# Patient Record
Sex: Female | Born: 1960 | Race: White | Hispanic: No | Marital: Married | Smoking: Never smoker
Health system: Southern US, Community
[De-identification: ages and names within clinical notes are randomized; demographics above are authoritative.]

## PROBLEM LIST (undated history)

## (undated) DIAGNOSIS — IMO0002 Reserved for concepts with insufficient information to code with codable children: Secondary | ICD-10-CM

## (undated) DIAGNOSIS — Z87828 Personal history of other (healed) physical injury and trauma: Secondary | ICD-10-CM

## (undated) HISTORY — PX: DILATION AND CURETTAGE OF UTERUS: SHX78

## (undated) HISTORY — DX: Reserved for concepts with insufficient information to code with codable children: IMO0002

## (undated) HISTORY — PX: BRAIN SURGERY: SHX531

## (undated) HISTORY — PX: OTHER SURGICAL HISTORY: SHX169

---

## 1991-05-03 DIAGNOSIS — IMO0002 Reserved for concepts with insufficient information to code with codable children: Secondary | ICD-10-CM

## 1991-05-03 HISTORY — DX: Reserved for concepts with insufficient information to code with codable children: IMO0002

## 1999-03-16 ENCOUNTER — Other Ambulatory Visit: Admission: RE | Admit: 1999-03-16 | Discharge: 1999-03-16 | Payer: Self-pay | Admitting: Obstetrics and Gynecology

## 2000-02-25 ENCOUNTER — Ambulatory Visit (HOSPITAL_COMMUNITY): Admission: RE | Admit: 2000-02-25 | Discharge: 2000-02-25 | Payer: Self-pay | Admitting: Obstetrics and Gynecology

## 2000-02-25 ENCOUNTER — Encounter: Payer: Self-pay | Admitting: Obstetrics and Gynecology

## 2000-03-16 ENCOUNTER — Other Ambulatory Visit: Admission: RE | Admit: 2000-03-16 | Discharge: 2000-03-16 | Payer: Self-pay | Admitting: Obstetrics and Gynecology

## 2001-03-22 ENCOUNTER — Other Ambulatory Visit: Admission: RE | Admit: 2001-03-22 | Discharge: 2001-03-22 | Payer: Self-pay | Admitting: Obstetrics and Gynecology

## 2001-08-25 ENCOUNTER — Encounter: Payer: Self-pay | Admitting: Emergency Medicine

## 2001-08-25 ENCOUNTER — Inpatient Hospital Stay (HOSPITAL_COMMUNITY): Admission: AC | Admit: 2001-08-25 | Discharge: 2001-08-29 | Payer: Self-pay

## 2001-08-26 ENCOUNTER — Encounter: Payer: Self-pay | Admitting: General Surgery

## 2001-08-27 ENCOUNTER — Encounter: Payer: Self-pay | Admitting: General Surgery

## 2003-01-21 ENCOUNTER — Other Ambulatory Visit: Admission: RE | Admit: 2003-01-21 | Discharge: 2003-01-21 | Payer: Self-pay | Admitting: Obstetrics and Gynecology

## 2003-02-07 ENCOUNTER — Encounter: Payer: Self-pay | Admitting: Obstetrics and Gynecology

## 2003-02-07 ENCOUNTER — Ambulatory Visit (HOSPITAL_COMMUNITY): Admission: RE | Admit: 2003-02-07 | Discharge: 2003-02-07 | Payer: Self-pay | Admitting: Obstetrics and Gynecology

## 2004-03-08 ENCOUNTER — Ambulatory Visit (HOSPITAL_COMMUNITY): Admission: RE | Admit: 2004-03-08 | Discharge: 2004-03-08 | Payer: Self-pay | Admitting: Obstetrics and Gynecology

## 2005-01-13 ENCOUNTER — Other Ambulatory Visit: Admission: RE | Admit: 2005-01-13 | Discharge: 2005-01-13 | Payer: Self-pay | Admitting: Obstetrics and Gynecology

## 2005-03-09 ENCOUNTER — Ambulatory Visit (HOSPITAL_COMMUNITY): Admission: RE | Admit: 2005-03-09 | Discharge: 2005-03-09 | Payer: Self-pay | Admitting: Obstetrics and Gynecology

## 2005-03-15 ENCOUNTER — Encounter: Admission: RE | Admit: 2005-03-15 | Discharge: 2005-03-15 | Payer: Self-pay

## 2005-08-14 ENCOUNTER — Emergency Department (HOSPITAL_COMMUNITY): Admission: EM | Admit: 2005-08-14 | Discharge: 2005-08-15 | Payer: Self-pay | Admitting: Emergency Medicine

## 2006-03-21 ENCOUNTER — Other Ambulatory Visit: Admission: RE | Admit: 2006-03-21 | Discharge: 2006-03-21 | Payer: Self-pay | Admitting: Obstetrics and Gynecology

## 2006-03-29 ENCOUNTER — Ambulatory Visit (HOSPITAL_COMMUNITY): Admission: RE | Admit: 2006-03-29 | Discharge: 2006-03-29 | Payer: Self-pay | Admitting: Obstetrics and Gynecology

## 2008-01-09 ENCOUNTER — Encounter: Admission: RE | Admit: 2008-01-09 | Discharge: 2008-01-09 | Payer: Self-pay | Admitting: Obstetrics and Gynecology

## 2010-05-22 ENCOUNTER — Encounter: Payer: Self-pay | Admitting: Obstetrics and Gynecology

## 2010-05-24 ENCOUNTER — Encounter: Payer: Self-pay | Admitting: Obstetrics and Gynecology

## 2010-09-17 NOTE — Discharge Summary (Signed)
[place]. [facility]  Patient:    Mackenzie Torres, Mackenzie Torres Visit Number: [id number] MRN: [id number]          Service Type: TRA Location: 5700 [id number] [id number] Attending Physician:  Trauma, Md Dictated by:   Eugenia Pancoast, P.A. Admit Date:  08/25/2001 Discharge Date: 08/29/2001   CC:         Barbara Cower Mohorn, D.D.S.   Discharge Summary  ADMISSION PHYSICIAN:  Lorne Skeens. Hoxworth, M.D.  FINAL DIAGNOSES: 1. Blunt head trauma. 2. Large scalp laceration. 3. Left shoulder contusion.  HISTORY OF PRESENT ILLNESS:  The patient is a 50 year old female who was working in her barn.  They were installing horse stalls in the barn with 600 pound panels they were putting up. One panel fell and caught her pushing her back and then hitting her in the back of her head which caused a severe flap laceration to the posterior scalp.  She was pinned down to the ground and her left shoulder was injured.  HOSPITAL COURSE:  The patient was brought to the [facility] emergency department and was seen there by Dr. Johna Sheriff.  The severity of the laceration required the patient to be taken to the operating room for cleaning of the laceration and suturing.  This was done successfully without any complications.  Postoperatively the patient did satisfactorily.  She did have a complaint and also a noted deformity of the right jaw area.  A CT scan of the face was done and Dr. Cherly Anderson was consulted.  The CT scan did not show any fractures although there was swelling of the tissue in that area. Subsequently Dr. Retta Mac felt the patient should follow up with him in approximately one week and will have her call his office at [phone] to make an appointment.  The patient is doing quite well at this time.  She was up and showered and the incision in the posterior scalp is clean and dry with no sign of infection.  She was given Percocet 5/225 one to two p.o. q.4-6h. p.r.n. pain and Motrin  800 mg one p.o. q.8h. p.r.n.  The patient is subsequently discharged to home in satisfactory and stable condition on August 29, 2001. She is to follow up on Tuesday, Sep 04, 2001 at 9:30 a.m. [facility].  At that time we will remove the sutures. Dictated by:   Eugenia Pancoast, P.A. Attending Physician:  Trauma, Md DD:  08/29/01 TD:  08/30/01 Job: [id number]

## 2010-09-17 NOTE — Op Note (Signed)
[place]. [facility]  Patient:    Mackenzie Torres, Mackenzie Torres Visit Number: [id number] MRN: [id number]          Service Type: TRA Location: 5700 5743 01 Attending Physician:  Trauma, Md Dictated by:   Lorne Skeens. Hoxworth, M.D. Proc. Date: 08/25/01 Admit Date:  08/25/2001                             Operative Report  PREOPERATIVE DIAGNOSIS:  Major scalp laceration and flap.  POSTOPERATIVE DIAGNOSIS:   Major scalp laceration and flap.  SURGICAL PROCEDURE:  Closure of scalp laceration.  SURGEON:  Lorne Skeens. Hoxworth, M.D.  ANESTHESIA:  General.  BRIEF HISTORY:  Mackenzie Torres is a 50 year old white female, who was struck on the back of the head by a prefabricated metal wall sustaining a large scalp laceration.  Work-up revealed no evidence of closed head injury or skull fracture.  Closure in the operating room has been recommended and accepted and she was brought for this procedure.  DESCRIPTION OF PROCEDURE:  The patient was brought to the operating room and placed in the supine position on the operating table.  A C-collar was left in place due to some neck tenderness despite negative radiologic work-up.  The C-collar was removed to careful inline traction was maintained while the patient was endotracheally intubated under general anesthesia without difficulty and the C-collar was replaced.  Carefully protecting the neck, the patient was turned into the right lateral decubitus position and carefully padded.  A large posterior laceration was irrigated, and the edges shaved and sterilely prepped, and draped.  This was a large superior based flap measuring 25 cm in length and the flap extended superiorly about 15 cm.  The galea was intact and no evidence of skull fracture.  Several arterial bleeders were suture ligated with 3-0 Vicryl.  The wound was further copiously irrigated. The laceration generally was very clean.  Hemostasis was obtained with  the sutures and cautery.  The subcutaneous tissue was then approximated with interrupted 3-0 Vicryl.  A 1/4 inch Penrose drain was left under the flap and brought out through the end of the incision and sutured in place.  The wound was then closed with running 3-0 nylon.  There appeared to be complete hemostasis at the end of the procedure.  Again, carefully protecting the neck, the patient was transferred to a stretcher and taken to recovery room in satisfactory condition. Dictated by:   Lorne Skeens. Hoxworth, M.D. Attending Physician:  Trauma, Md DD:  08/25/01 TD:  08/26/01 Job: [id number]

## 2012-04-05 ENCOUNTER — Other Ambulatory Visit: Payer: Self-pay | Admitting: Obstetrics & Gynecology

## 2012-04-05 DIAGNOSIS — Z139 Encounter for screening, unspecified: Secondary | ICD-10-CM

## 2012-04-12 ENCOUNTER — Encounter: Payer: Self-pay | Admitting: Obstetrics & Gynecology

## 2012-04-12 ENCOUNTER — Ambulatory Visit: Payer: Self-pay

## 2012-04-12 ENCOUNTER — Ambulatory Visit (INDEPENDENT_AMBULATORY_CARE_PROVIDER_SITE_OTHER): Payer: BC Managed Care – PPO | Admitting: Obstetrics & Gynecology

## 2012-04-12 VITALS — BP 115/76 | HR 92 | Temp 98.0°F | Resp 16 | Ht 68.0 in | Wt 169.0 lb

## 2012-04-12 DIAGNOSIS — R5383 Other fatigue: Secondary | ICD-10-CM

## 2012-04-12 DIAGNOSIS — R5381 Other malaise: Secondary | ICD-10-CM

## 2012-04-12 DIAGNOSIS — Z1151 Encounter for screening for human papillomavirus (HPV): Secondary | ICD-10-CM

## 2012-04-12 DIAGNOSIS — Z124 Encounter for screening for malignant neoplasm of cervix: Secondary | ICD-10-CM

## 2012-04-12 DIAGNOSIS — Z01419 Encounter for gynecological examination (general) (routine) without abnormal findings: Secondary | ICD-10-CM

## 2012-04-12 DIAGNOSIS — M1380 Other specified arthritis, unspecified site: Secondary | ICD-10-CM

## 2012-04-12 NOTE — Progress Notes (Signed)
  Subjective:     Mackenzie Torres is a 51 y.o. female here for a routine exam.  Current complaints: fatigue.  Personal health questionnaire reviewed: yes.   Gynecologic History No LMP recorded. Patient is not currently having periods (Reason: Perimenopausal). Contraception: none Last Pap: 2010 per pt. Results were: normal per pt Last mammogram: ??. Results were: all ahave been nml per pt  Obstetric History OB History    Grav Para Term Preterm Abortions TAB SAB Ect Mult Living   3 2 2  1  1   2      # Outc Date GA Lbr Len/2nd Wgt Sex Del Anes PTL Lv   1 TRM            2 TRM            3 SAB                The following portions of the patient's history were reviewed and updated as appropriate: allergies, current medications, past family history, past medical history, past social history, past surgical history and problem list.  Review of Systems Pertinent items are noted in HPI.    Objective:    Vitals:  WNL General appearance: alert, cooperative and no distress Head: Normocephalic, without obvious abnormality, atraumatic Eyes: negative Throat: lips, mucosa, and tongue normal; teeth and gums normal Lungs: clear to auscultation bilaterally Breasts: normal appearance, no masses or tenderness, No nipple retraction or dimpling, No nipple discharge or bleeding Heart: regular rate and rhythm Abdomen: soft, non-tender; bowel sounds normal; no masses,  no organomegaly Pelvic: cervix normal in appearance, external genitalia normal, no adnexal masses or tenderness, no bladder tenderness, no cervical motion tenderness, perianal skin: no external genital warts noted, rectovaginal septum normal, urethra without abnormality or discharge, uterus normal size, shape, and consistency with mild descent, and vagina normal without discharge (pale pink in color) Extremities: no edema, redness or tenderness in the calves or thighs Skin: no lesions or rash Lymph nodes: Axillary adenopathy: none        Assessment:    Healthy female exam.    Plan:    Education reviewed: calcium supplements, self breast exams and skin cancer screening. Mammogram ordered. Follow up in: 1 year. Vit D and TSH drawn today Pap smear Colonoscopy

## 2012-04-12 NOTE — Patient Instructions (Addendum)
calVitamin D Test This is a test used to investigate a problem related to bone metabolism or parathyroid function. It is often used if you have an abnormal calcium, phosphorus, and/or parathyroid hormone level or if you have evidence of bone disease or bone weakness These tests measure the concentrations of various forms of vitamin D in your blood. The term vitamin D actually refers to a number of different but related chemical compounds (termed sterols), some of which are inactive and some of which are active forms. The two most common vitamin D tests measure calcidiol (an inactive form) and calcitriol (the active form). The test for calcidiol (sometimes called 25-hydroxy-vitamin D) is used to assure that the body has adequate vitamin D supply. The test for calcitriol (sometimes called 1,25 dihydroxy-vitamin D) is used to assure that the kidney is converting an appropriate amount of calcidiol to the active hormone calcitriol.  The main role of the active hormone calcitriol is to help regulate the absorption of calcium, phosphorus, and (to a lesser extent) magnesium. Vitamin D is vital for the growth and health of bone; without it, bones will be soft, malformed, and unable to repair themselves normally, resulting in diseases called rickets in children and osteomalacia in adults.  Vitamin D comes from two sources. The body is able to form vitamin D by exposure to sunlight. This is the basis of referring to vitamin D as the sunshine vitamin -- it is formed from 7-dehydrocholesterol in the skin when the skin is exposed to light. Vitamin D also can be ingested -- either in foods or in vitamin supplements. The different compounds of vitamin D are distinguished by the use of subscript numbers. Vitamin D2 comes from diet and vitamin preparations. Vitamin D3 is endogenous (produced in the body). Vitamins D2 and D3 are slightly different chemical structures, but both lead to production of the active hormone calcitriol.   PREPARATION FOR TEST A blood sample is obtained by inserting a needle into a vein in the arm. NORMAL FINDINGS Ranges for normal findings may vary among different laboratories and hospitals. You should always check with your doctor after having lab work or other tests done to discuss the meaning of your test results and whether your values are considered within normal limits. MEANING OF TEST  Your caregiver will go over the test results with you and discuss the importance and meaning of your results, as well as treatment options and the need for additional tests, if necessary. Reference values are dependent on many factors, including patient age, gender, sample population, and testing method. Numeric test results have different meanings in different labs. Your lab report should include the specific reference range for your test.   Low blood levels of calcidiol may mean that you are not getting enough exposure to sunlight or enough dietary vitamin D to meet your body's demand; that there is a problem with its absorption from the intestines; or that enough is not being converted to calcidiol in the liver (which means that it is not making it into the bloodstream). Occasionally, drugs used to treat seizures, particularly phenytoin (Dilantin), can interfere with the liver's production of calcidiol.  High levels of calcidiol usually reflect excess supplementation from vitamin pills or other nutritional supplements.  Low levels of calcitriol are often seen in kidney disease and are one of the earliest changes to occur in persons with early kidney failure.  High levels of calcitriol may occur when there is excess parathryoid hormone or when there are diseases,  such as sarcoidosis or some lymphomas that can make calcitriol outside of the kidneys. OBTAINING THE TEST RESULTS It is your responsibility to obtain your test results. Ask the lab or department performing the test when and how you will get your  results. Document Released: 05/13/2004 Document Revised: 07/11/2011 Document Reviewed: 04/18/2005 [facility] Patient Information 2013 [place], [place]. Calcium Intake Recommendations Age group / Amount of calcium to consume daily, in milligrams (mg)  0 to 6 months / 210 mg  7 to 12 months / 270 mg  1 to 3 years / 500 mg  4 to 8 years / 800 mg  9 to 18 years / 1,300 mg  19 to 50 years / 1,000 mg  51 to 70+ years / 1,200 mg  Pregnant and nursing, under 19 years / 1,300 mg  Pregnant and nursing, over 19 years / 1,000 mg Document Released: 12/01/2003 Document Revised: 07/11/2011 Document Reviewed: 04/18/2005 [facility] Patient Information 2013 [place], [place].

## 2012-04-13 LAB — VITAMIN D 25 HYDROXY (VIT D DEFICIENCY, FRACTURES): Vit D, 25-Hydroxy: 36 ng/mL (ref 30–89)

## 2012-04-13 LAB — TSH: TSH: 1.91 u[IU]/mL (ref 0.350–4.500)

## 2012-04-14 ENCOUNTER — Encounter: Payer: Self-pay | Admitting: Obstetrics & Gynecology

## 2012-04-14 DIAGNOSIS — M1380 Other specified arthritis, unspecified site: Secondary | ICD-10-CM | POA: Insufficient documentation

## 2012-04-16 ENCOUNTER — Telehealth: Payer: Self-pay | Admitting: *Deleted

## 2012-04-16 ENCOUNTER — Encounter: Payer: Self-pay | Admitting: *Deleted

## 2012-04-16 NOTE — Telephone Encounter (Signed)
Copy of labs and a letter mailed to pt's home address.

## 2012-04-17 ENCOUNTER — Ambulatory Visit (INDEPENDENT_AMBULATORY_CARE_PROVIDER_SITE_OTHER): Payer: BC Managed Care – PPO

## 2012-04-17 DIAGNOSIS — Z139 Encounter for screening, unspecified: Secondary | ICD-10-CM

## 2012-04-17 DIAGNOSIS — Z1231 Encounter for screening mammogram for malignant neoplasm of breast: Secondary | ICD-10-CM

## 2012-05-15 ENCOUNTER — Ambulatory Visit (INDEPENDENT_AMBULATORY_CARE_PROVIDER_SITE_OTHER): Payer: BC Managed Care – PPO | Admitting: Obstetrics & Gynecology

## 2012-05-15 ENCOUNTER — Encounter: Payer: Self-pay | Admitting: Obstetrics & Gynecology

## 2012-05-15 VITALS — BP 129/86 | HR 75 | Temp 97.0°F | Resp 16 | Ht 67.0 in | Wt 167.0 lb

## 2012-05-15 DIAGNOSIS — IMO0002 Reserved for concepts with insufficient information to code with codable children: Secondary | ICD-10-CM | POA: Insufficient documentation

## 2012-05-15 DIAGNOSIS — Z9889 Other specified postprocedural states: Secondary | ICD-10-CM

## 2012-05-15 DIAGNOSIS — R87612 Low grade squamous intraepithelial lesion on cytologic smear of cervix (LGSIL): Secondary | ICD-10-CM

## 2012-05-15 DIAGNOSIS — Z01812 Encounter for preprocedural laboratory examination: Secondary | ICD-10-CM

## 2012-05-15 LAB — POCT URINE PREGNANCY: Preg Test, Ur: NEGATIVE

## 2012-05-15 NOTE — Patient Instructions (Signed)
Loop Electrosurgical Excision Procedure Loop electrosurgical excision procedure (LEEP) is the removal of a portion of the lower part of the uterus (cervix). The procedure is done when there are significantly abnormal cervical cell changes. Abnormal cell changes of the cervix can lead to cancer if left in place and untreated.  The LEEP procedure itself typically only takes a few minutes. Often, it may be done in your caregiver's office. The procedure is considered safe for those who wish to get pregnant or are trying to get pregnant. Only under rare circumstances should this procedure be done if you are pregnant. LET YOUR CAREGIVER KNOW ABOUT:  Whether you are pregnant or late for your last menstrual period.  Allergies to foods or medicines.  All the medicines you are taking includingherbs, eyedrops, and over-the-counter medicines, and creams.  Use of steroids (by mouth or creams).  Previous problems with anesthetics or numbing medicine.  Previous gynecological surgery.  History of blood clots or bleeding problems.  Any recent or current vaginal infections (herpes, sexually transmitted infections).  Other health problems. RISKS AND COMPLICATIONS  Bleeding.  Infection.  Injury to the vagina, bladder, or rectum.  Very rare obstruction of the cervical opening that causes problems during menstruation (cervical stenosis). BEFORE THE PROCEDURE  Do not take aspirin or blood thinners (anticoagulants) for 1 week before the procedure, or as told by your caregiver.  Eat a light meal before the procedure.  Ask your caregiver about changing or stopping your regular medicines.  You may be given a pain reliever 1 or 2 hours before the procedure. PROCEDURE   A tool (speculum) is placed in the vagina. This allows your caregiver to see the cervix.  An iodine stain is applied to the cervix to find the area of abnormal cells to be removed.  Medicine is injected to numb the cervix (local  anesthetic).   Electricity is passed through a thin wire loop which is then used to remove (cauterize) a small segment of the affected cervix.  Light electrocautery is used to seal any small blood vessels and prevent bleeding.  A paste may be applied to the cauterized area of the cervix to help prevent bleeding.  The tissue sample is sent to the lab. It is examined under the microscope. AFTER THE PROCEDURE  Have someone drive you home.  You may have slight to moderate cramping.  You may notice a black vaginal discharge from the paste used on the cervix to prevent bleeding. This is normal.  Watch for excessive bleeding. This requires immediate medical care.  Ask when your test results will be ready. Make sure you get your test results. Document Released: 07/09/2002 Document Revised: 07/11/2011 Document Reviewed: 09/28/2010 [employer] Patient Information 2013 [employer].  

## 2012-05-15 NOTE — Addendum Note (Signed)
Addended by: Granville Lewis on: 05/15/2012 05:35 PM   Modules accepted: Orders

## 2012-05-15 NOTE — Progress Notes (Signed)
  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 The prior pap showed no abnormalities (per patient).  Prior cervical/vaginal disease: abnml pap 20 yrs ago post partum while breast feeding. Prior cervical treatment: no treatment.  All other pap smears have been nml.  Procedure Details  The risks and benefits of the procedure and Written informed consent obtained.  Speculum placed in vagina and excellent visualization of cervix achieved, cervix swabbed x 3 with acetic acid solution.  Findings: Cervix: visible lesion(s) at 7 o'clock; cervix swabbed with Lugol's solution, SCJ visualized - lesion at 7 o'clock and cervical biopsies taken at 7 o'clock.  ECC obtained. Vaginal inspection: normal without visible lesions. Vulvar colposcopy: vulvar colposcopy not performed.  Specimens: cervical biopsy at 7 o'clock   Complications: none.  Plan: Will base further treatment on Pathology findings.

## 2012-05-19 ENCOUNTER — Encounter: Payer: Self-pay | Admitting: Obstetrics & Gynecology

## 2012-05-21 ENCOUNTER — Telehealth: Payer: Self-pay | Admitting: *Deleted

## 2012-05-21 NOTE — Telephone Encounter (Signed)
Pt notified of cervical biopsy results.  She is to repeat pap with co-testing in one year.

## 2013-06-06 ENCOUNTER — Encounter: Payer: Self-pay | Admitting: Emergency Medicine

## 2013-06-06 ENCOUNTER — Emergency Department (INDEPENDENT_AMBULATORY_CARE_PROVIDER_SITE_OTHER): Payer: BC Managed Care – PPO

## 2013-06-06 ENCOUNTER — Emergency Department
Admission: EM | Admit: 2013-06-06 | Discharge: 2013-06-06 | Disposition: A | Discharge status: Home / Self Care | Payer: BC Managed Care – PPO | Source: Home / Self Care | Attending: Family Medicine | Admitting: Family Medicine

## 2013-06-06 DIAGNOSIS — J984 Other disorders of lung: Secondary | ICD-10-CM

## 2013-06-06 DIAGNOSIS — R062 Wheezing: Secondary | ICD-10-CM

## 2013-06-06 DIAGNOSIS — J40 Bronchitis, not specified as acute or chronic: Secondary | ICD-10-CM

## 2013-06-06 HISTORY — DX: Personal history of other (healed) physical injury and trauma: Z87.828

## 2013-06-06 MED ORDER — IPRATROPIUM-ALBUTEROL 0.5-2.5 (3) MG/3ML IN SOLN
3.0000 mL | Freq: Once | RESPIRATORY_TRACT | Status: AC
  Administered 2013-06-06: 3 mL via RESPIRATORY_TRACT

## 2013-06-06 MED ORDER — BENZONATATE 100 MG PO CAPS: 100.0000 mg | ORAL_CAPSULE | Freq: Three times a day (TID) | ORAL | Status: DC | PRN

## 2013-06-06 MED ORDER — ALBUTEROL SULFATE HFA 108 (90 BASE) MCG/ACT IN AERS: 2.0000 | INHALATION_SPRAY | RESPIRATORY_TRACT | Status: AC | PRN

## 2013-06-06 MED ORDER — METHYLPREDNISOLONE SODIUM SUCC 125 MG IJ SOLR: 125.0000 mg | Freq: Once | INTRAMUSCULAR | Status: DC

## 2013-06-06 NOTE — ED Notes (Signed)
Mackenzie Torres c/o cough and congestions since end of December. Cough will not "breakup". She has had a fever for the past 3 nights with aches. Denies smoking history. She is currently on doxycycline.

## 2013-06-06 NOTE — ED Provider Notes (Signed)
CSN: [id number]     Arrival date & time 06/06/13  1614 History   First MD Initiated Contact with Patient 06/06/13 1622     Chief Complaint  Patient presents with  . Cough    HPI  URI Symptoms Onset: 5-6 weeks Description: persistent cough  Modifying factors:  Initially had URI 5-6 weeks ago that resolved mildly. Baseline hx/o severe abx/medication allergies with recurring hives-followed by [facility]. Was started on doxy by immunology earlier in the week. Sxs improving. Still with cough and wheezing.   Symptoms Nasal discharge: yes Fever: initially earlier in the week. Now resolved  Sore throat: no Cough: yes Wheezing: yes Ear pain: no GI symptoms: no Sick contacts: no  Red Flags  Stiff neck: no Dyspnea: no Rash: no Swallowing difficulty: no  Sinusitis Risk Factors Headache/face pain: no Double sickening: no tooth pain: no  Allergy Risk Factors Sneezing: no Itchy scratchy throat: no Seasonal symptoms: no  Flu Risk Factors Headache: no muscle aches: no severe fatigue: no   Past Medical History  Diagnosis Date  . Abnormal pap 1993    colpo and bx  . Abnormal Pap smear   . History of head injury    Past Surgical History  Procedure Laterality Date  . Head surgery      2007  . Dilation and curettage of uterus    . Brain surgery     Family History  Problem Relation Age of Onset  . Heart attack Father   . Heart attack Brother    History  Substance Use Topics  . Smoking status: Never Smoker   . Smokeless tobacco: Never Used  . Alcohol Use: Yes     Comment: very rarely   OB History   Grav Para Term Preterm Abortions TAB SAB Ect Mult Living   3 2 2  1  1   2      Review of Systems  All other systems reviewed and are negative.    Allergies  Augmentin; Codeine; and Clarithromycin  Home Medications   Current Outpatient Rx  Name  Route  Sig  Dispense  Refill  . doxycycline (VIBRAMYCIN) 100 MG capsule   Oral   Take 100 mg by mouth 2 (two)  times daily.         . benzonatate (TESSALON) 100 MG capsule   Oral   Take 1-2 capsules (100-200 mg total) by mouth 3 (three) times daily as needed for cough.   40 capsule   0   . cetirizine (ZYRTEC) 10 MG tablet   Oral   Take 10 mg by mouth daily.         . famotidine (PEPCID) 10 MG tablet   Oral   Take 10 mg by mouth daily.          BP 116/79  Pulse 99  Temp(Src) 98.3 F (36.8 C) (Oral)  Resp 20  Ht 5\' 8"  (1.727 m)  Wt 163 lb (73.936 kg)  BMI 24.79 kg/m2  SpO2 99% Physical Exam  Constitutional: She is oriented to person, place, and time. She appears well-developed and well-nourished.  HENT:  Head: Normocephalic and atraumatic.  Right Ear: External ear normal.  Left Ear: External ear normal.  +nasal erythema, rhinorrhea bilaterally, + post oropharyngeal erythema    Eyes: Conjunctivae are normal. Pupils are equal, round, and reactive to light.  Neck: Normal range of motion. Neck supple.  Cardiovascular: Normal rate and regular rhythm.   Pulmonary/Chest: Effort normal and breath sounds normal.  Abdominal: Soft.  Musculoskeletal: Normal range of motion.  Neurological: She is alert and oriented to person, place, and time.  Skin: Skin is warm.    ED Course  Procedures (including critical care time) Labs Review Labs Reviewed - No data to display Imaging Review Dg Chest 2 View  06/06/2013   CLINICAL DATA:  Cough and congestion for 6 weeks with fever. Question pneumonia.  EXAM: CHEST  2 VIEW  COMPARISON:  None.  FINDINGS: The heart size and mediastinal contours are normal. The lungs are mildly hyperinflated but clear. There is no pleural effusion or pneumothorax. The osseous structures appear normal.  IMPRESSION: Mild obstructive lung disease. No evidence of pneumonia or other acute process.   Electronically Signed   By: Roxy Horseman M.D.   On: 06/06/2013 16:49      MDM   1. Bronchitis   2. Wheezing   CXR negative for PNA.  Pulse ox reassuring.  Duoneb  treatment x1 with mild improvement in aeration.  Pt vehemently refused IM/PO glucocorticoids. States that it makes her crazy.  Discussed with pt that lung inflammation is likely predominant source of sxs. Would benefit greatly from short course of glucocorticoids to help break bronchitic cycle. Instructed pt to follow up with immunology/PCP about this issue.  Discussed infectious and resp red flags at length Follow up with [facility] within the next week.      The patient and/or caregiver has been counseled thoroughly with regard to treatment plan and/or medications prescribed including dosage, schedule, interactions, rationale for use, and possible side effects and they verbalize understanding. Diagnoses and expected course of recovery discussed and will return if not improved as expected or if the condition worsens. Patient and/or caregiver verbalized understanding.        Doree Albee, MD 06/06/13 [phone]

## 2013-06-09 ENCOUNTER — Telehealth: Payer: Self-pay | Admitting: *Deleted

## 2013-12-19 ENCOUNTER — Ambulatory Visit (INDEPENDENT_AMBULATORY_CARE_PROVIDER_SITE_OTHER): Payer: BC Managed Care – PPO | Admitting: Obstetrics & Gynecology

## 2013-12-19 ENCOUNTER — Ambulatory Visit (INDEPENDENT_AMBULATORY_CARE_PROVIDER_SITE_OTHER): Payer: BC Managed Care – PPO

## 2013-12-19 ENCOUNTER — Encounter: Payer: Self-pay | Admitting: Obstetrics & Gynecology

## 2013-12-19 VITALS — BP 116/73 | HR 80 | Resp 16 | Ht 68.0 in | Wt 158.0 lb

## 2013-12-19 DIAGNOSIS — Z1151 Encounter for screening for human papillomavirus (HPV): Secondary | ICD-10-CM

## 2013-12-19 DIAGNOSIS — Z01419 Encounter for gynecological examination (general) (routine) without abnormal findings: Secondary | ICD-10-CM

## 2013-12-19 DIAGNOSIS — Z8639 Personal history of other endocrine, nutritional and metabolic disease: Secondary | ICD-10-CM

## 2013-12-19 DIAGNOSIS — Z124 Encounter for screening for malignant neoplasm of cervix: Secondary | ICD-10-CM | POA: Diagnosis not present

## 2013-12-19 DIAGNOSIS — Z0000 Encounter for general adult medical examination without abnormal findings: Secondary | ICD-10-CM

## 2013-12-19 DIAGNOSIS — Z1329 Encounter for screening for other suspected endocrine disorder: Secondary | ICD-10-CM

## 2013-12-19 DIAGNOSIS — N870 Mild cervical dysplasia: Secondary | ICD-10-CM

## 2013-12-19 NOTE — Progress Notes (Signed)
Subjective:    Mackenzie Torres is a 53 y.o.SW P2 (22 and 55 yo kids) female who presents for an annual exam. The patient has no complaints today. The patient is not currently sexually active. GYN screening history: last pap: was abnormal: ASCUS with CIn1 on biopsy. The patient wears seatbelts: yes. The patient participates in regular exercise: yes. Has the patient ever been transfused or tattooed?: yes. The patient reports that there is not domestic violence in her life.   Menstrual History: OB History   Grav Para Term Preterm Abortions TAB SAB Ect Mult Living   3 2 2  1  1   2       Menarche age: [age]  No LMP recorded. Patient is postmenopausal.    The following portions of the patient's history were reviewed and updated as appropriate: allergies, current medications, past family history, past medical history, past social history, past surgical history and problem list.  Review of Systems A comprehensive review of systems was negative.    Objective:    BP 116/73  Pulse 80  Resp 16  Ht 5\' 8"  (1.727 m)  Wt 158 lb (71.668 kg)  BMI 24.03 kg/m2  General Appearance:    Alert, cooperative, no distress, appears stated age  Head:    Normocephalic, without obvious abnormality, atraumatic  Eyes:    PERRL, conjunctiva/corneas clear, EOM's intact, fundi    benign, both eyes  Ears:    Normal TM's and external ear canals, both ears  Nose:   Nares normal, septum midline, mucosa normal, no drainage    or sinus tenderness  Throat:   Lips, mucosa, and tongue normal; teeth and gums normal  Neck:   Supple, symmetrical, trachea midline, no adenopathy;    thyroid:  no enlargement/tenderness/nodules; no carotid   bruit or JVD  Back:     Symmetric, no curvature, ROM normal, no CVA tenderness  Lungs:     Clear to auscultation bilaterally, respirations unlabored  Chest Wall:    No tenderness or deformity   Heart:    Regular rate and rhythm, S1 and S2 normal, no murmur, rub   or gallop  Breast Exam:     No tenderness, masses, or nipple abnormality  Abdomen:     Soft, non-tender, bowel sounds active all four quadrants,    no masses, no organomegaly  Genitalia:    Normal female without lesion, discharge or tenderness, moderate/severe atrophy, NSSR, mobile, NT, normal adnexal exam     Extremities:   Extremities normal, atraumatic, no cyanosis or edema  Pulses:   2+ and symmetric all extremities  Skin:   Skin color, texture, turgor normal, no rashes or lesions  Lymph nodes:   Cervical, supraclavicular, and axillary nodes normal  Neurologic:   CNII-XII intact, normal strength, sensation and reflexes    throughout  .    Assessment:    Healthy female exam.    Plan:     Breast self exam technique reviewed and patient encouraged to perform self-exam monthly. Mammogram. Thin prep Pap smear. with cotesting TSH, free T4 Vitamin D level per patient request

## 2013-12-20 ENCOUNTER — Telehealth: Payer: Self-pay | Admitting: *Deleted

## 2013-12-20 LAB — TSH: TSH: 1.669 u[IU]/mL (ref 0.350–4.500)

## 2013-12-20 LAB — T3, FREE: T3 FREE: 3 pg/mL (ref 2.3–4.2)

## 2013-12-20 LAB — VITAMIN D 25 HYDROXY (VIT D DEFICIENCY, FRACTURES): VIT D 25 HYDROXY: 53 ng/mL (ref 30–89)

## 2013-12-20 LAB — T4, FREE: FREE T4: 1.08 ng/dL (ref 0.80–1.80)

## 2013-12-20 NOTE — Telephone Encounter (Signed)
Pt notified of normal labs

## 2013-12-23 LAB — CYTOLOGY - PAP

## 2014-03-03 ENCOUNTER — Encounter: Payer: Self-pay | Admitting: Obstetrics & Gynecology

## 2015-03-12 ENCOUNTER — Other Ambulatory Visit: Payer: Self-pay | Admitting: Obstetrics & Gynecology

## 2015-03-12 DIAGNOSIS — Z1231 Encounter for screening mammogram for malignant neoplasm of breast: Secondary | ICD-10-CM

## 2015-04-01 ENCOUNTER — Ambulatory Visit (INDEPENDENT_AMBULATORY_CARE_PROVIDER_SITE_OTHER): Payer: BLUE CROSS/BLUE SHIELD | Admitting: Obstetrics & Gynecology

## 2015-04-01 ENCOUNTER — Ambulatory Visit (INDEPENDENT_AMBULATORY_CARE_PROVIDER_SITE_OTHER): Payer: BLUE CROSS/BLUE SHIELD

## 2015-04-01 ENCOUNTER — Encounter: Payer: Self-pay | Admitting: Obstetrics & Gynecology

## 2015-04-01 VITALS — BP 138/85 | HR 79 | Resp 16 | Ht 68.0 in | Wt 149.0 lb

## 2015-04-01 DIAGNOSIS — Z124 Encounter for screening for malignant neoplasm of cervix: Secondary | ICD-10-CM | POA: Diagnosis not present

## 2015-04-01 DIAGNOSIS — Z1151 Encounter for screening for human papillomavirus (HPV): Secondary | ICD-10-CM | POA: Diagnosis not present

## 2015-04-01 DIAGNOSIS — Z01419 Encounter for gynecological examination (general) (routine) without abnormal findings: Secondary | ICD-10-CM

## 2015-04-01 DIAGNOSIS — Z23 Encounter for immunization: Secondary | ICD-10-CM | POA: Diagnosis not present

## 2015-04-01 DIAGNOSIS — Z8349 Family history of other endocrine, nutritional and metabolic diseases: Secondary | ICD-10-CM

## 2015-04-01 DIAGNOSIS — Z1231 Encounter for screening mammogram for malignant neoplasm of breast: Secondary | ICD-10-CM

## 2015-04-01 LAB — LIPID PANEL
CHOLESTEROL: 247 mg/dL — AB (ref 125–200)
HDL: 59 mg/dL (ref >=46)
LDL Cholesterol: 168 mg/dL — ABNORMAL HIGH (ref <130)
TRIGLYCERIDES: 100 mg/dL (ref <150)
Total CHOL/HDL Ratio: 4.2 Ratio (ref <=5.0)
VLDL: 20 mg/dL (ref <30)

## 2015-04-01 LAB — CBC
HCT: 45.1 % (ref 36.0–46.0)
Hemoglobin: 15.1 g/dL — ABNORMAL HIGH (ref 12.0–15.0)
MCH: 29.8 pg (ref 26.0–34.0)
MCHC: 33.5 g/dL (ref 30.0–36.0)
MCV: 89.1 fL (ref 78.0–100.0)
MPV: 10 fL (ref 8.6–12.4)
Platelets: 264 10*3/uL (ref 150–400)
RBC: 5.06 MIL/uL (ref 3.87–5.11)
RDW: 13.8 % (ref 11.5–15.5)
WBC: 7 10*3/uL (ref 4.0–10.5)

## 2015-04-01 LAB — COMPREHENSIVE METABOLIC PANEL
ALT: 18 U/L (ref 6–29)
AST: 17 U/L (ref 10–35)
Albumin: 4.1 g/dL (ref 3.6–5.1)
Alkaline Phosphatase: 56 U/L (ref 33–130)
BUN: 15 mg/dL (ref 7–25)
CO2: 25 mmol/L (ref 20–31)
Calcium: 8.8 mg/dL (ref 8.6–10.4)
Chloride: 104 mmol/L (ref 98–110)
Creat: 0.86 mg/dL (ref 0.50–1.05)
Glucose, Bld: 76 mg/dL (ref 65–99)
Potassium: 3.6 mmol/L (ref 3.5–5.3)
Sodium: 140 mmol/L (ref 135–146)
Total Bilirubin: 0.7 mg/dL (ref 0.2–1.2)
Total Protein: 6.3 g/dL (ref 6.1–8.1)

## 2015-04-01 LAB — FERRITIN: Ferritin: 151 ng/mL (ref 10–291)

## 2015-04-01 NOTE — Progress Notes (Signed)
  Subjective:    Mackenzie Torres is a [age] y.o. female who presents for an annual exam. The patient has no complaints today. The patient is not currently sexually active. GYN screening history: last pap: was normal. The patient wears seatbelts: yes. The patient participates in regular exercise: yes. Has the patient ever been transfused or tattooed?: no. The patient reports that there is not domestic violence in her life.   Menstrual History: OB History    Gravida Para Term Preterm AB TAB SAB Ectopic Multiple Living   3 2 2  1  1   2        No LMP recorded. Patient is postmenopausal.    The following portions of the patient's history were reviewed and updated as appropriate: allergies, current medications, past family history, past medical history, past social history, past surgical history and problem list.  Review of Systems Pertinent items noted in HPI and remainder of comprehensive ROS otherwise negative.    Objective:   Vitals:  WNL General appearance: alert, cooperative and no distress  HEENT: Normocephalic, without obvious abnormality, atraumatic Eyes: negative Throat: lips, mucosa, and tongue normal; teeth and gums normal  Respiratory: Clear to auscultation bilaterally  CV: Regular rate and rhythm  Breasts:  Normal appearance, no masses or tenderness, no nipple retraction or dimpling  GI: Soft, non-tender; bowel sounds normal; no masses,  no organomegaly  GU: External Genitalia:  Tanner V, no lesion Urethra:  No prolapse   Vagina: Pink, normal rugae, no blood or discharge  Cervix: No CMT, no lesion  Uterus:  Normal size and contour, non tender  Adnexa: Normal, no masses, non tender  Musculoskeletal: No edema, redness or tenderness in the calves or thighs  Skin: No lesions or rash  Lymphatic: Axillary adenopathy: none    Psychiatric: Normal mood and behavior    Filed Vitals:   04/01/15 0906  BP: 138/85  Pulse: 79  Resp: 16  Height: 5\' 8"  (1.727 m)  Weight: 149 lb  (67.586 kg)   Assessment:    Healthy female exam.   Family history of elevated ferritin / hemachromatosis  Plan:     MRI pituitary. Pap smear.   Cbc, TSH, Vit D, CMP, chol panel, ferritin Colonoscopy recommended 1200 mg calcium per day in diet and vitamin. Flu shot

## 2015-04-02 ENCOUNTER — Telehealth: Payer: Self-pay | Admitting: *Deleted

## 2015-04-02 LAB — VITAMIN D 25 HYDROXY (VIT D DEFICIENCY, FRACTURES): VIT D 25 HYDROXY: 31 ng/mL (ref 30–100)

## 2015-04-02 LAB — HEPATITIS C ANTIBODY: HCV AB: NEGATIVE

## 2015-04-02 NOTE — Telephone Encounter (Signed)
Copy of labs mailed to pt with instructions to watch fat intake and consider a low fat diet or F/U with PVP.

## 2015-04-03 LAB — CYTOLOGY - PAP

## 2018-02-12 ENCOUNTER — Ambulatory Visit: Payer: BLUE CROSS/BLUE SHIELD | Admitting: Obstetrics & Gynecology

## 2018-02-19 ENCOUNTER — Encounter: Payer: Self-pay | Admitting: Obstetrics & Gynecology

## 2018-02-19 ENCOUNTER — Ambulatory Visit: Payer: BLUE CROSS/BLUE SHIELD | Admitting: Obstetrics & Gynecology

## 2018-02-19 VITALS — BP 119/78 | HR 84 | Ht 68.0 in | Wt 161.0 lb

## 2018-02-19 DIAGNOSIS — Z1239 Encounter for other screening for malignant neoplasm of breast: Secondary | ICD-10-CM | POA: Diagnosis not present

## 2018-02-19 DIAGNOSIS — N6452 Nipple discharge: Secondary | ICD-10-CM | POA: Diagnosis not present

## 2018-02-19 NOTE — Progress Notes (Signed)
   Subjective:    Patient ID: Mackenzie Torres, female    DOB: 06-Jan-1961, 57 y.o.   MRN: [id number]  HPI  Pt c/o bloody discharge from right breast since last Sept.  Pt can expres blodd from the nipple. Only on the right side.  Bleeding doesn't occur everyday.  Pt does have bad hives and had w/u at [facility] and no cause was found.  Pt had outbreak of hives in July 2019 and thought the discharge   Review of Systems  Respiratory: Negative.   Cardiovascular: Negative.   Gastrointestinal: Negative.   Musculoskeletal: Negative for arthralgias.  Skin: Positive for rash.       Objective:   Physical Exam  Constitutional: She is oriented to person, place, and time. She appears well-developed and well-nourished. No distress.  HENT:  Head: Normocephalic and atraumatic.  Eyes: Conjunctivae are normal.  Cardiovascular: Normal rate.  Pulmonary/Chest: Effort normal. Right breast exhibits no inverted nipple, no mass, no nipple discharge and no tenderness.  Musculoskeletal: She exhibits no edema.  Neurological: She is alert and oriented to person, place, and time.  Skin: Skin is warm and dry.  Psychiatric: She has a normal mood and affect.  Vitals reviewed.  Vitals:   02/19/18 1258  BP: 119/78  Pulse: 84  Weight: 161 lb (73 kg)  Height: 5\' 8"  (1.727 m)   Assessment & Plan:  57 yo female with bright red bloody discharge from right breast since September  1.  Screening mammogram 2.  Referral to General surgery for evaluation 3.  Needs appt for annual exam.

## 2018-02-19 NOTE — Progress Notes (Signed)
Pt noticed blood coming from right nipple- started at the end of September.   Last mammogram-04-01-15- negative Last pap- 04/01/15- negative

## 2018-02-21 ENCOUNTER — Ambulatory Visit (INDEPENDENT_AMBULATORY_CARE_PROVIDER_SITE_OTHER): Payer: BLUE CROSS/BLUE SHIELD

## 2018-02-21 DIAGNOSIS — Z1239 Encounter for other screening for malignant neoplasm of breast: Secondary | ICD-10-CM

## 2018-02-21 DIAGNOSIS — Z1231 Encounter for screening mammogram for malignant neoplasm of breast: Secondary | ICD-10-CM

## 2018-03-12 ENCOUNTER — Other Ambulatory Visit: Payer: Self-pay | Admitting: General Surgery

## 2018-03-12 DIAGNOSIS — N6452 Nipple discharge: Secondary | ICD-10-CM

## 2018-03-20 ENCOUNTER — Ambulatory Visit
Admission: RE | Admit: 2018-03-20 | Discharge: 2018-03-20 | Disposition: A | Discharge status: Home / Self Care | Payer: BLUE CROSS/BLUE SHIELD | Source: Ambulatory Visit | Attending: General Surgery | Admitting: General Surgery

## 2018-03-20 DIAGNOSIS — N6452 Nipple discharge: Secondary | ICD-10-CM

## 2018-05-18 ENCOUNTER — Encounter: Payer: Self-pay | Admitting: *Deleted

## 2018-07-18 ENCOUNTER — Ambulatory Visit: Payer: BLUE CROSS/BLUE SHIELD | Admitting: Internal Medicine

## 2019-01-11 IMAGING — US ULTRASOUND RIGHT BREAST LIMITED
1 series · 6 of 6 positions shown · non-contrast
Comparison: Previous exam(s).

CLINICAL DATA: 57-year-old female presenting for evaluation of
spontaneous right nipple discharge which started in Sunday December, 2017. For several weeks she noted bloody discharge in her bra. The
discharge stopped about 3 days before her screening mammogram on
February 21, 2018. She has had no discharge since.

EXAM:
ULTRASOUND OF THE RIGHT BREAST

[Series 1: ultrasound right breast limited · 0.05mm/px · 6 of 6 slices shown]
[im 1/6]
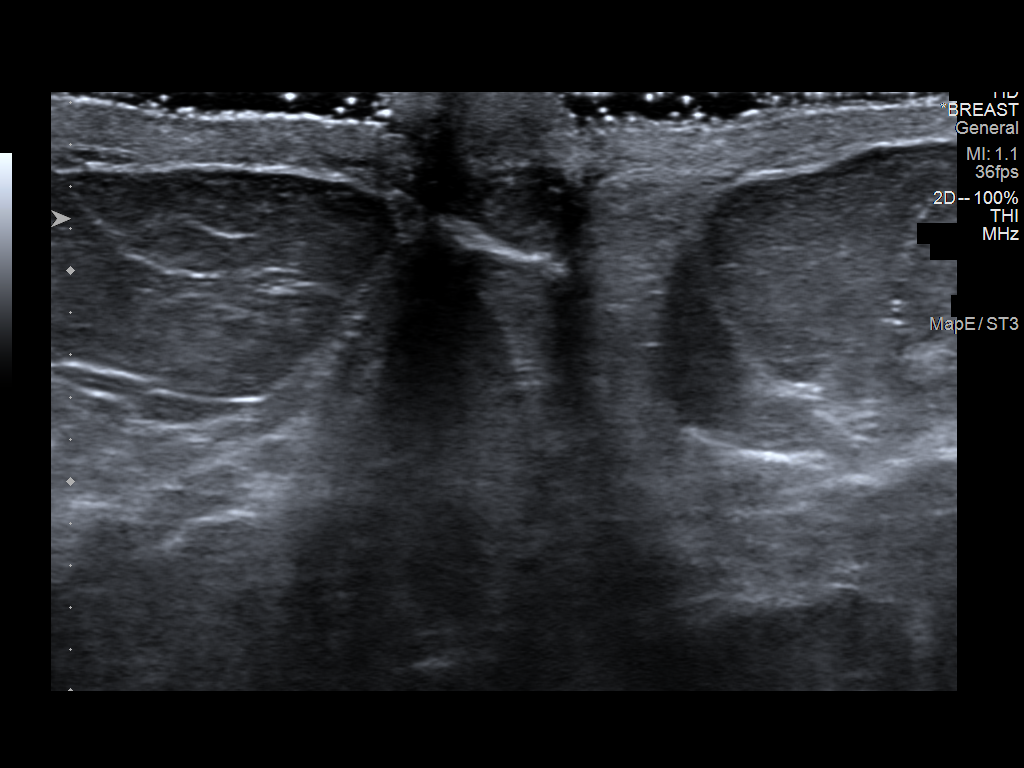
[im 2/6]
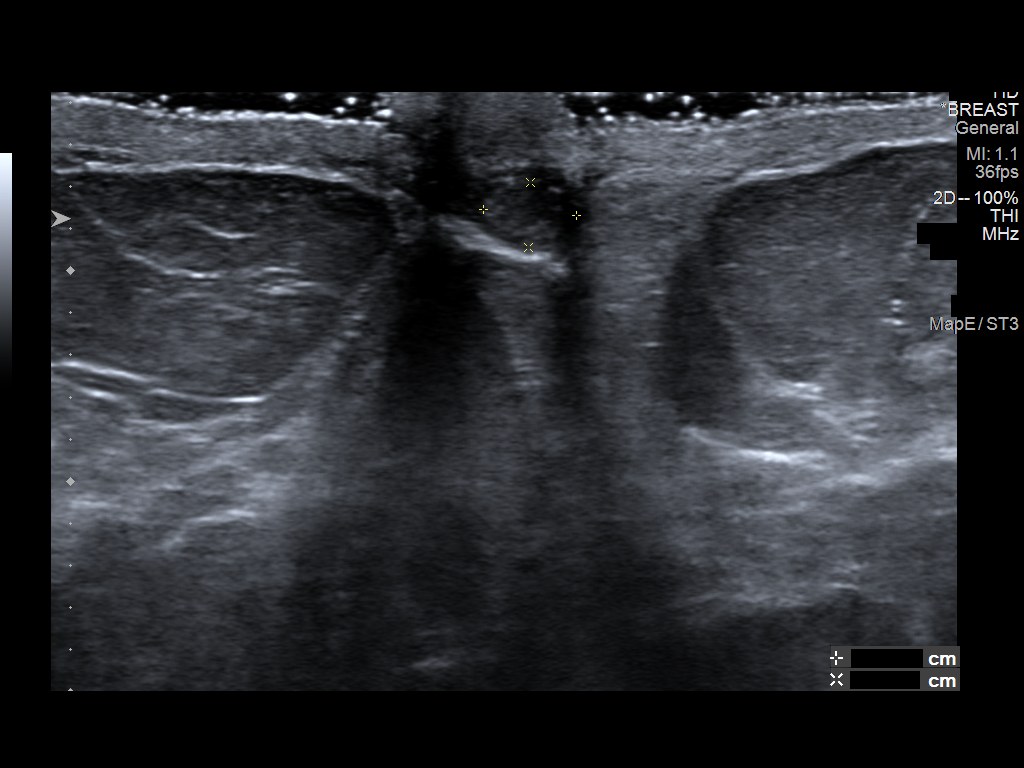
[im 3/6]
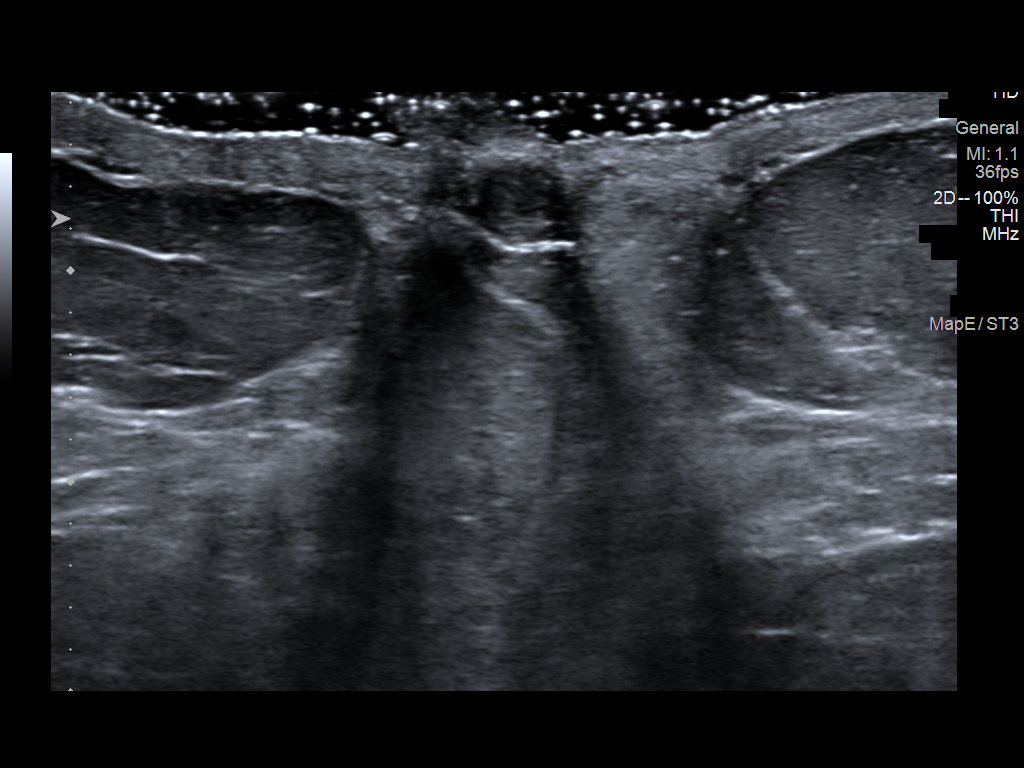
[im 4/6]
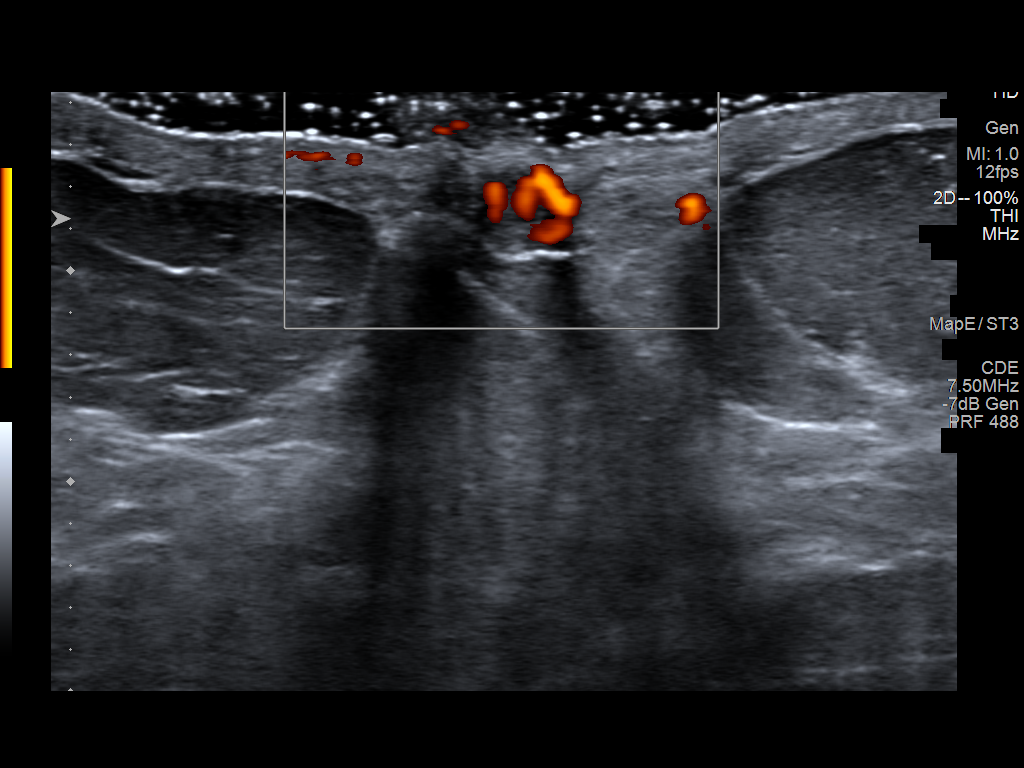
[im 5/6]
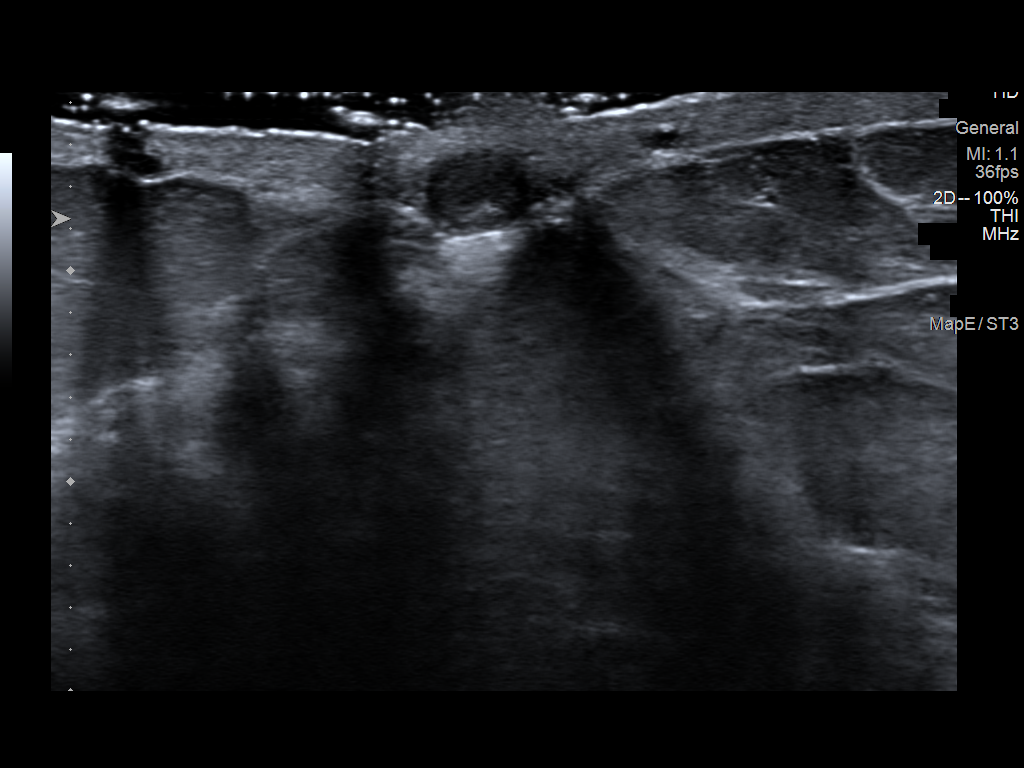
[im 6/6]
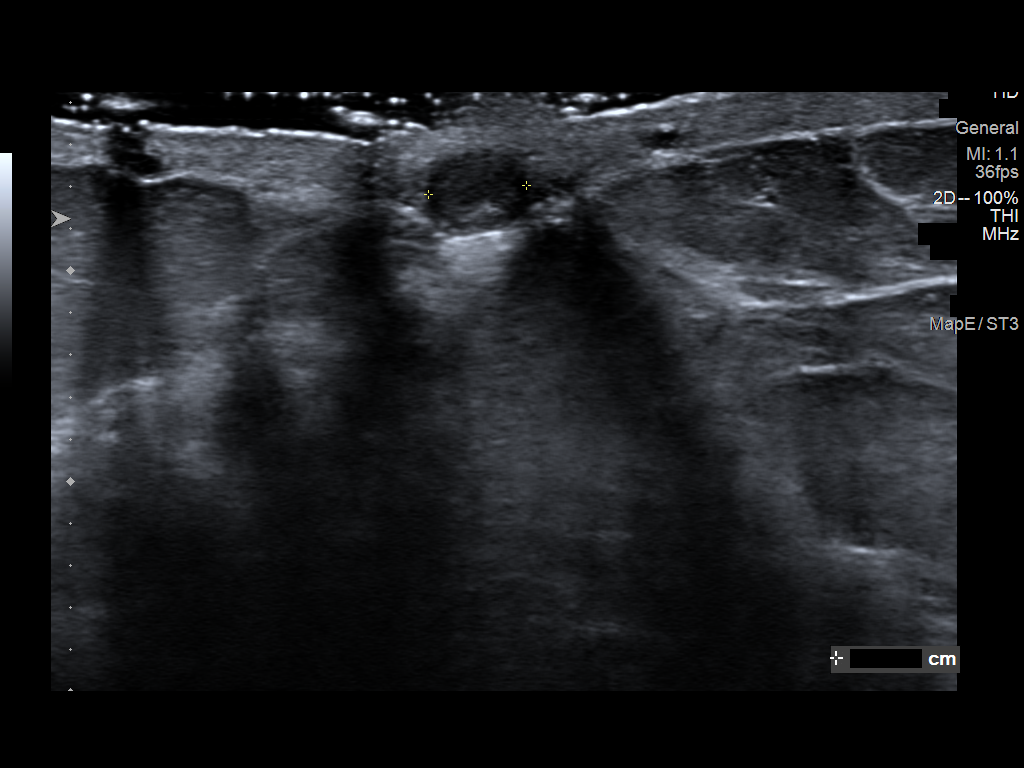

[6 of 6 positions shown; findings below may reference images not displayed]

FINDINGS: On physical exam, a small amount of brownish red discharge could be
elicited from a single duct in the central to slightly upper outer
aspect of the nipple.

Targeted ultrasound is performed, showing a circumscribed oval
hypoechoic mass in the subareolar right breast at 6 o'clock
measuring 5 x 3 x 4 mm. The mass is abutting and at least partially
within the skin of the nipple areolar complex. Blood flow was seen
within the mass on color Doppler imaging.
IMPRESSION: There is a right breast mass at 6 o'clock in the subareolar right
breast, which may account for the patient's bloody nipple discharge.

RECOMMENDATION:
Given the superficial location of the mass, partially within the
skin and the very close proximity of the mass to the nipple,
excisional biopsy is recommended. The patient is planning to
schedule follow-up appointment with Dr. Ourari for further
discussion.

I have discussed the findings and recommendations with the patient.
Results were also provided in writing at the conclusion of the
visit. If applicable, a reminder letter will be sent to the patient
regarding the next appointment.

BI-RADS CATEGORY  4: Suspicious.

## 2019-08-19 ENCOUNTER — Other Ambulatory Visit: Payer: Self-pay

## 2019-08-19 DIAGNOSIS — Z8249 Family history of ischemic heart disease and other diseases of the circulatory system: Secondary | ICD-10-CM

## 2019-08-27 ENCOUNTER — Other Ambulatory Visit: Payer: Self-pay

## 2019-08-27 ENCOUNTER — Ambulatory Visit (INDEPENDENT_AMBULATORY_CARE_PROVIDER_SITE_OTHER)
Admission: RE | Admit: 2019-08-27 | Discharge: 2019-08-27 | Disposition: A | Discharge status: Home / Self Care | Payer: Self-pay | Source: Ambulatory Visit | Attending: Cardiovascular Disease | Admitting: Cardiovascular Disease

## 2019-08-27 DIAGNOSIS — Z8249 Family history of ischemic heart disease and other diseases of the circulatory system: Secondary | ICD-10-CM

## 2020-06-19 IMAGING — CT CT CARDIAC CORONARY ARTERY CALCIUM SCORE
3 series · 14 of 20 positions shown, 15 images · non-contrast
Comparison: None.
COMPARISON: None.

Addendum:
EXAM:
OVER-READ INTERPRETATION  CT CHEST

The following report is an over-read performed by radiologist Dr.
Shamir Harrelson [[email or web address]] on 08/27/2019. This over-read
does not include interpretation of cardiac or coronary anatomy or
pathology. The coronary calcium score interpretation by the
cardiologist is attached.
CLINICAL DATA: 58F for risk stratification
Coronary Calcium Score
TECHNIQUE: The patient was scanned on a Siemens Force scanner. Axial
non-contrast 3 mm slices were carried out through the heart. The
data set was analyzed on a dedicated work station and scored using
the Agatson method.

[Series 2: casc 3.0 bv41 2 bestdiast 68 % · axial · 0.38mm/px · z∈[-220,-139]mm · 4 of 47 slices shown, 5 images]
[im 10/47  vessel]
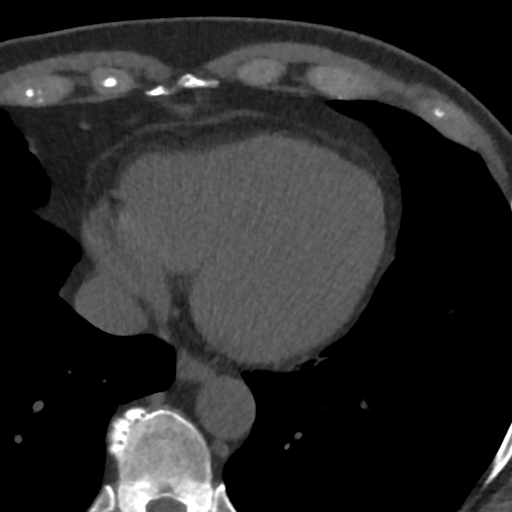
[im 10/47  lung]
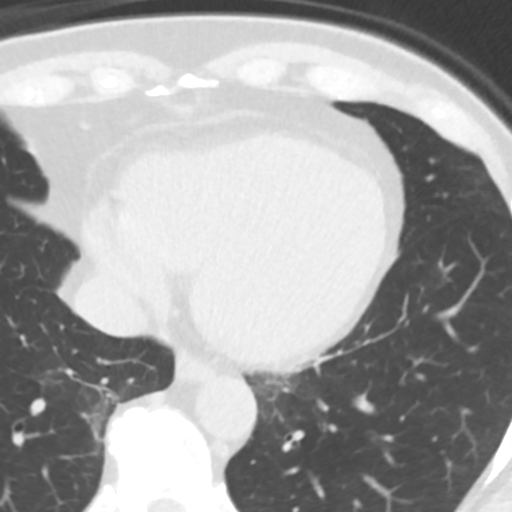
[im 19/47  vessel]
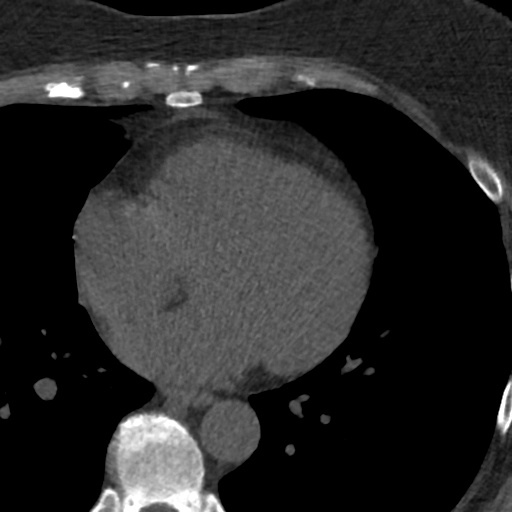
[im 28/47  vessel]
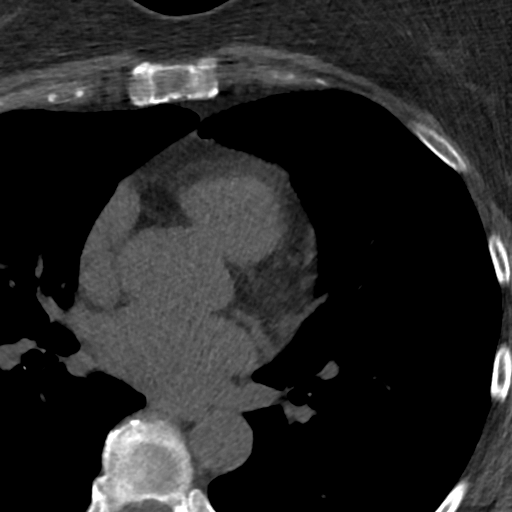
[im 37/47  vessel]
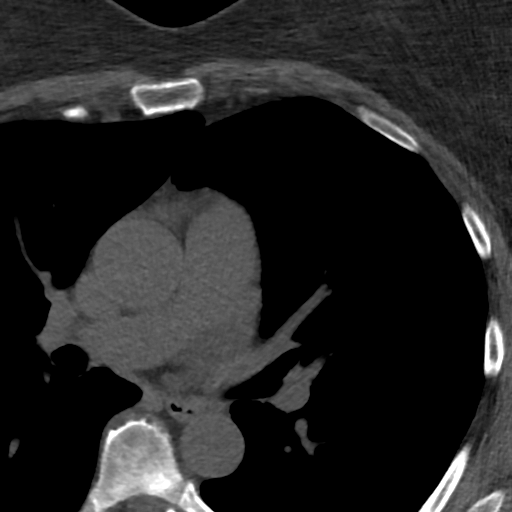

[Series 3: lung 72 % · axial · 0.68mm/px · z∈[-226,-132]mm · 5 of 47 slices shown]
[im 8/47  lung]
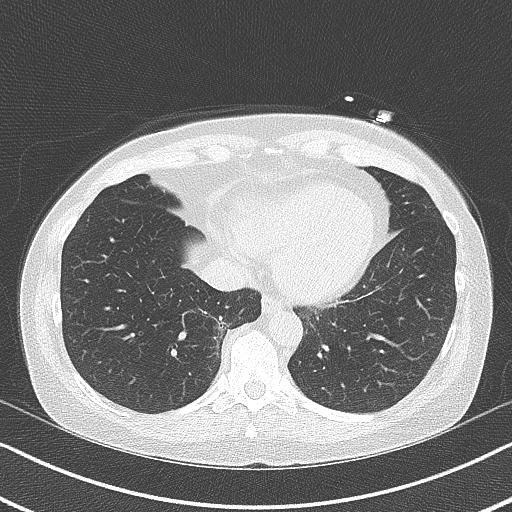
[im 16/47  lung]
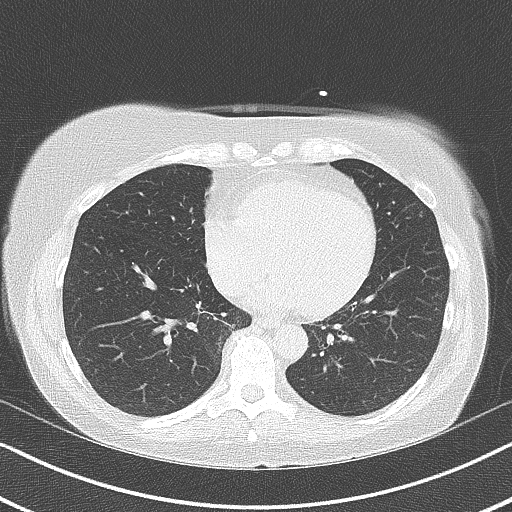
[im 24/47  lung]
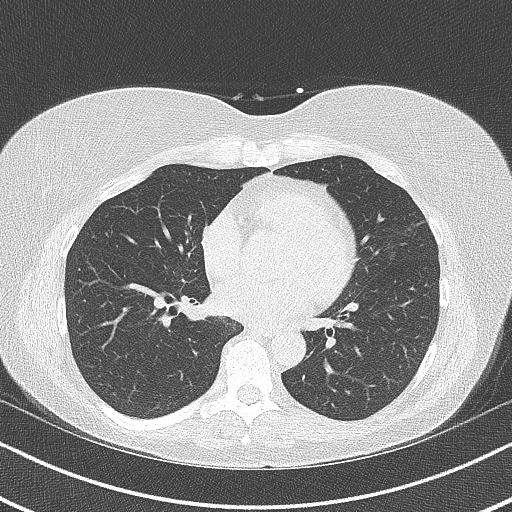
[im 31/47  lung]
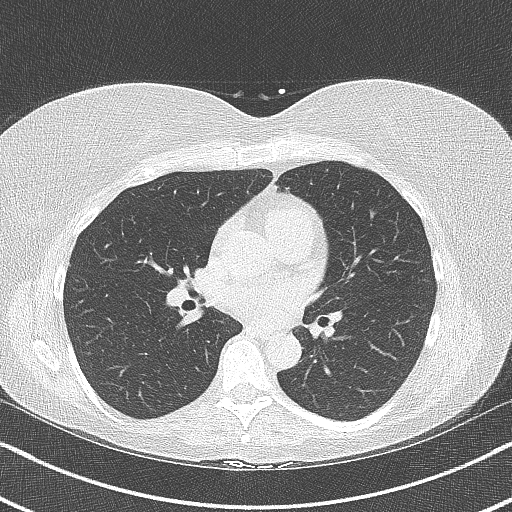
[im 39/47  lung]
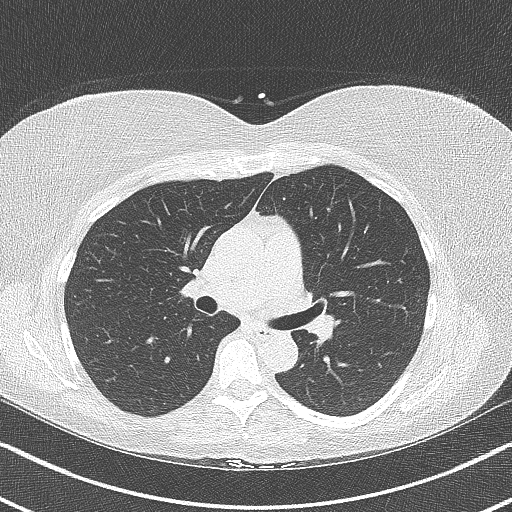

[Series 4: lung st 72 % · axial · 0.68mm/px · z∈[-226,-132]mm · 5 of 47 slices shown]
[im 8/47  lung]
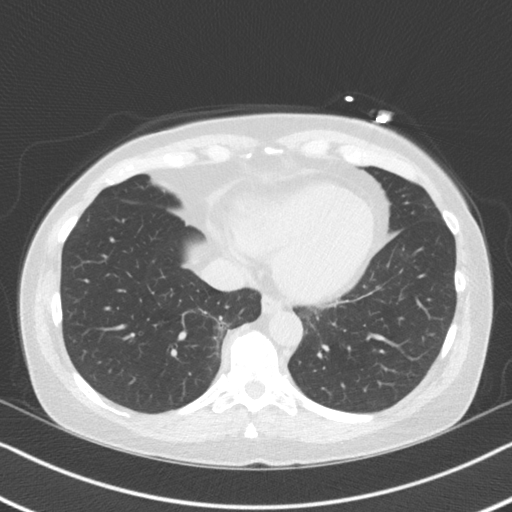
[im 16/47  lung]
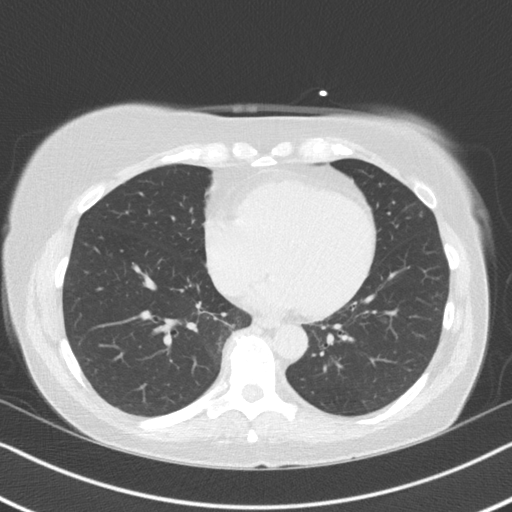
[im 24/47  lung]
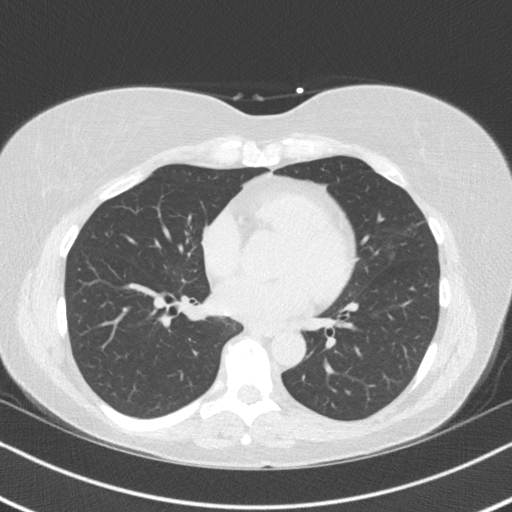
[im 31/47  lung]
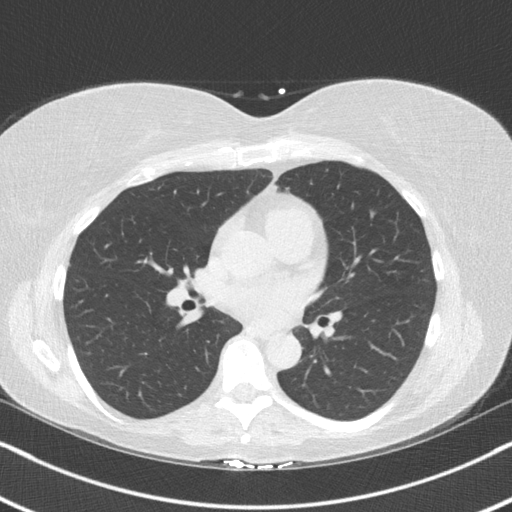
[im 39/47  lung]
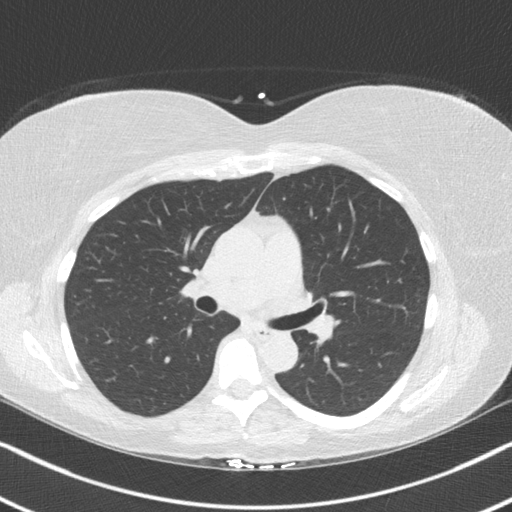

[14 of 20 positions shown; findings below may reference images not displayed]

FINDINGS: Vascular: Heart is normal size. Visualized aorta normal caliber.
Punctate calcification in the aortic root.

Mediastinum/Nodes: No adenopathy in the lower mediastinum or hila.

Lungs/Pleura: No confluent opacities or effusions. Scarring at the
right lung base.

Upper Abdomen: Imaging into the upper abdomen shows no acute
findings.

Musculoskeletal: Chest wall soft tissues are unremarkable. No acute
bony abnormality.
IMPRESSION: No acute extra cardiac abnormality.

Single punctate calcification within the aortic root.
FINDINGS: Non-cardiac: See separate report from [REDACTED].

Ascending Aorta: 3.4 cm. Normal size. Minimal calcification at the
aortic root.

Pericardium: Normal

Coronary arteries: Normal origins.  No calcification.
IMPRESSION: Coronary calcium score of 0. This was 0 percentile for age and sex
matched control.

*** End of Addendum ***
EXAM:
OVER-READ INTERPRETATION  CT CHEST

The following report is an over-read performed by radiologist Dr.
Shamir Harrelson [[email or web address]] on 08/27/2019. This over-read
does not include interpretation of cardiac or coronary anatomy or
pathology. The coronary calcium score interpretation by the
cardiologist is attached.
FINDINGS: Vascular: Heart is normal size. Visualized aorta normal caliber.
Punctate calcification in the aortic root.

Mediastinum/Nodes: No adenopathy in the lower mediastinum or hila.

Lungs/Pleura: No confluent opacities or effusions. Scarring at the
right lung base.

Upper Abdomen: Imaging into the upper abdomen shows no acute
findings.

Musculoskeletal: Chest wall soft tissues are unremarkable. No acute
bony abnormality.
IMPRESSION: No acute extra cardiac abnormality.

Single punctate calcification within the aortic root.
